# Patient Record
Sex: Female | Born: 2000 | Race: Black or African American | Hispanic: No | Marital: Single | Smoking: Never smoker
Health system: Southern US, Community
[De-identification: ages and names within clinical notes are randomized; demographics above are authoritative.]

---
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
## 2020-10-14 ENCOUNTER — Emergency Department (HOSPITAL_COMMUNITY)
Admission: EM | Admit: 2020-10-14 | Discharge: 2020-10-14 | Disposition: A | Discharge status: Home / Self Care | Payer: Medicaid Other | Attending: Emergency Medicine | Admitting: Emergency Medicine

## 2020-10-14 ENCOUNTER — Encounter (HOSPITAL_COMMUNITY): Payer: Self-pay | Admitting: Emergency Medicine

## 2020-10-14 ENCOUNTER — Other Ambulatory Visit: Payer: Self-pay

## 2020-10-14 DIAGNOSIS — S39012A Strain of muscle, fascia and tendon of lower back, initial encounter: Secondary | ICD-10-CM | POA: Diagnosis not present

## 2020-10-14 DIAGNOSIS — Y998 Other external cause status: Secondary | ICD-10-CM | POA: Diagnosis not present

## 2020-10-14 DIAGNOSIS — Y92410 Unspecified street and highway as the place of occurrence of the external cause: Secondary | ICD-10-CM | POA: Insufficient documentation

## 2020-10-14 DIAGNOSIS — Y9389 Activity, other specified: Secondary | ICD-10-CM | POA: Diagnosis not present

## 2020-10-14 DIAGNOSIS — S3992XA Unspecified injury of lower back, initial encounter: Secondary | ICD-10-CM | POA: Diagnosis present

## 2020-10-14 MED ORDER — NAPROXEN 500 MG PO TABS
500.0000 mg | ORAL_TABLET | Freq: Once | ORAL | Status: AC
  Administered 2020-10-14: 500 mg via ORAL
  Filled 2020-10-14: qty 1
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
## 2020-10-15 ENCOUNTER — Emergency Department (HOSPITAL_COMMUNITY): Payer: Medicaid Other

## 2020-10-15 ENCOUNTER — Encounter (HOSPITAL_COMMUNITY): Payer: Self-pay | Admitting: Emergency Medicine

## 2020-10-15 ENCOUNTER — Emergency Department (HOSPITAL_COMMUNITY)
Admission: EM | Admit: 2020-10-15 | Discharge: 2020-10-15 | Disposition: A | Discharge status: Home / Self Care | Payer: Medicaid Other | Attending: Emergency Medicine | Admitting: Emergency Medicine

## 2020-10-15 DIAGNOSIS — M542 Cervicalgia: Secondary | ICD-10-CM | POA: Diagnosis not present

## 2020-10-15 DIAGNOSIS — R519 Headache, unspecified: Secondary | ICD-10-CM | POA: Insufficient documentation

## 2020-10-15 MED ORDER — NAPROXEN 375 MG PO TABS: 375.0000 mg | ORAL_TABLET | Freq: Two times a day (BID) | ORAL | 0 refills | Status: AC

## 2020-10-15 MED ORDER — ACETAMINOPHEN 500 MG PO TABS
1000.0000 mg | ORAL_TABLET | Freq: Once | ORAL | Status: AC
  Administered 2020-10-15: 1000 mg via ORAL
  Filled 2020-10-15: qty 2

## 2020-10-15 MED ORDER — LIDOCAINE 5 % EX PTCH
1.0000 | MEDICATED_PATCH | CUTANEOUS | Status: DC
  Administered 2020-10-15: 1 via TRANSDERMAL
  Filled 2020-10-15: qty 1

## 2020-10-15 MED ORDER — LIDOCAINE 5 % EX PTCH: 1.0000 | MEDICATED_PATCH | CUTANEOUS | 0 refills | Status: AC

## 2020-10-15 MED ORDER — NAPROXEN 500 MG PO TABS
500.0000 mg | ORAL_TABLET | ORAL | Status: AC
  Administered 2020-10-15: 500 mg via ORAL
  Filled 2020-10-15: qty 1
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
## 2021-05-17 ENCOUNTER — Encounter (HOSPITAL_BASED_OUTPATIENT_CLINIC_OR_DEPARTMENT_OTHER): Payer: Self-pay | Admitting: Emergency Medicine

## 2021-05-17 ENCOUNTER — Other Ambulatory Visit: Payer: Self-pay

## 2021-05-17 ENCOUNTER — Emergency Department (HOSPITAL_BASED_OUTPATIENT_CLINIC_OR_DEPARTMENT_OTHER)
Admission: EM | Admit: 2021-05-17 | Discharge: 2021-05-17 | Disposition: A | Discharge status: Home / Self Care | Payer: Medicaid Other | Attending: Emergency Medicine | Admitting: Emergency Medicine

## 2021-05-17 DIAGNOSIS — R200 Anesthesia of skin: Secondary | ICD-10-CM | POA: Diagnosis not present

## 2021-05-17 DIAGNOSIS — Z5321 Procedure and treatment not carried out due to patient leaving prior to being seen by health care provider: Secondary | ICD-10-CM | POA: Insufficient documentation

## 2021-05-17 NOTE — ED Notes (Signed)
Pt reported to registration clerk that she was leaving  

## 2021-05-17 NOTE — ED Triage Notes (Signed)
Pt states she feels numb all over  Pt states it started tonight  Pt states she is on medication for a tooth infection, an antibiotic and ibuprofen and also is taking an antidepressant  Pt states she has been drinking tonight as well
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
